# Patient Record
Sex: Male | Born: 1965 | Race: White | Hispanic: No | Marital: Married | Smoking: Never smoker
Health system: Southern US, Community
[De-identification: ages and names within clinical notes are randomized; demographics above are authoritative.]

## PROBLEM LIST (undated history)

## (undated) DIAGNOSIS — K219 Gastro-esophageal reflux disease without esophagitis: Secondary | ICD-10-CM

## (undated) HISTORY — DX: Gastro-esophageal reflux disease without esophagitis: K21.9

## (undated) HISTORY — PX: KNEE ARTHROSCOPY: SUR90

## (undated) HISTORY — PX: HERNIA REPAIR: SHX51

---

## 2014-08-03 ENCOUNTER — Ambulatory Visit: Payer: Self-pay | Admitting: Family Medicine

## 2014-08-17 ENCOUNTER — Ambulatory Visit: Payer: Self-pay | Admitting: Physician Assistant

## 2017-08-04 ENCOUNTER — Encounter: Payer: Self-pay | Admitting: *Deleted

## 2017-08-04 ENCOUNTER — Telehealth: Payer: Self-pay | Admitting: Registered Nurse

## 2017-08-04 ENCOUNTER — Encounter: Payer: Self-pay | Admitting: Registered Nurse

## 2017-08-04 ENCOUNTER — Ambulatory Visit
Admission: EM | Admit: 2017-08-04 | Discharge: 2017-08-04 | Disposition: A | Discharge status: Home / Self Care | Payer: BLUE CROSS/BLUE SHIELD | Attending: Registered Nurse | Admitting: Registered Nurse

## 2017-08-04 ENCOUNTER — Ambulatory Visit
Admit: 2017-08-04 | Discharge: 2017-08-04 | Disposition: A | Discharge status: Home / Self Care | Payer: BLUE CROSS/BLUE SHIELD | Attending: Registered Nurse | Admitting: Registered Nurse

## 2017-08-04 DIAGNOSIS — R2242 Localized swelling, mass and lump, left lower limb: Secondary | ICD-10-CM

## 2017-08-04 DIAGNOSIS — M79662 Pain in left lower leg: Secondary | ICD-10-CM | POA: Diagnosis not present

## 2017-08-04 DIAGNOSIS — M7989 Other specified soft tissue disorders: Secondary | ICD-10-CM

## 2017-08-04 DIAGNOSIS — M79605 Pain in left leg: Secondary | ICD-10-CM | POA: Insufficient documentation

## 2017-08-04 MED ORDER — SULFAMETHOXAZOLE-TRIMETHOPRIM 800-160 MG PO TABS: 1.0000 | ORAL_TABLET | Freq: Two times a day (BID) | ORAL | 0 refills | Status: DC

## 2017-08-04 MED ORDER — PREDNISONE 10 MG (21) PO TBPK: ORAL_TABLET | ORAL | 0 refills | Status: DC

## 2017-08-04 MED ORDER — ACETAMINOPHEN 500 MG PO TABS: 1000.0000 mg | ORAL_TABLET | Freq: Four times a day (QID) | ORAL | 0 refills | Status: AC | PRN

## 2017-08-04 NOTE — ED Triage Notes (Signed)
Pt awoke this am with left calf pain, and edema. Denies injury.

## 2017-08-04 NOTE — ED Provider Notes (Signed)
[facility]    CSN: [id number] Arrival date & time: 08/04/17  0850     History   Chief Complaint Chief Complaint  Patient presents with  . Leg Pain    HPI Brian Church is a 52 y.o. male.   51y/o married caucasian male established patient here for evaluation left lower leg pain woke him up in the middle of the night. Has not taken any otc medications and not on any prescription medications.  Swollen, warm spouse a nurse worried he might have blood clot so here for evaluation.  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  Walked a lot this weekend at [facility] otherwise no change in exercise habits, denied trauma.  Denied joint aches, headache, nausea, vomiting, vision changes, tingling/numbness, weakness.  Right hand dominant.  Spouse measured calf this morning and left 1 inch larger than right      History reviewed. No pertinent past medical history.  There are no active problems to display for this patient.   Past Surgical History:  Procedure Laterality Date  . HERNIA REPAIR    . KNEE ARTHROSCOPY Right        Home Medications    Prior to Admission medications   Medication Sig Start Date End Date Taking? Authorizing Provider  acetaminophen (TYLENOL) 500 MG tablet Take 2 tablets (1,000 mg total) by mouth every 6 (six) hours as needed. 08/04/17   Xayla Puzio, Jarold Song, NP    Family History Family History  Problem Relation Age of Onset  . Healthy Mother   . Cancer Father     Social History Social History   Tobacco Use  . Smoking status: Never Smoker  . Smokeless tobacco: Never Used  Substance Use Topics  . Alcohol use: Yes  . Drug use: No     Allergies   Patient has no known allergies.   Review of Systems Review of Systems  Constitutional: Negative for activity change, appetite change, chills, diaphoresis, fatigue, fever and unexpected weight change.  HENT: Negative for  congestion, ear pain, facial swelling, mouth sores, sore throat, trouble swallowing and voice change.   Eyes: Negative for photophobia, pain, discharge, redness, itching and visual disturbance.  Respiratory: Negative for cough, chest tightness, shortness of breath and wheezing.   Cardiovascular: Negative for chest pain, palpitations and leg swelling.  Gastrointestinal: Negative for abdominal pain, blood in stool, diarrhea, nausea and vomiting.  Endocrine: Negative for cold intolerance and heat intolerance.  Genitourinary: Negative for difficulty urinating, dysuria and hematuria.  Musculoskeletal: Positive for myalgias. Negative for arthralgias, back pain, gait problem, joint swelling, neck pain and neck stiffness.  Skin: Positive for color change and rash. Negative for pallor and wound.  Allergic/Immunologic: Negative for environmental allergies and food allergies.  Neurological: Negative for dizziness, tremors, syncope, speech difficulty, weakness, numbness and headaches.  Hematological: Negative for adenopathy. Does not bruise/bleed easily.  Psychiatric/Behavioral: Positive for sleep disturbance. Negative for agitation and confusion. The patient is not nervous/anxious.      Physical Exam Triage Vital Signs ED Triage Vitals  Enc Vitals Group     BP 08/04/17 0905 138/82     Pulse Rate 08/04/17 0905 77     Resp 08/04/17 0905 16     Temp 08/04/17 0905 98.3 F (36.8 C)     Temp Source 08/04/17 0905 Oral     SpO2 08/04/17 0905 98 %     Weight 08/04/17 0910  190 lb (86.2 kg)     Height 08/04/17 0910 5\' 11"  (1.803 m)     Head Circumference --      Peak Flow --      Pain Score 08/04/17 0908 6     Pain Loc --      Pain Edu? --      Excl. in GC? --    No data found.  Updated Vital Signs BP 138/82 (BP Location: Left Arm)   Pulse 77   Temp 98.3 F (36.8 C) (Oral)   Resp 16   Ht 5\' 11"  (1.803 m)   Wt 190 lb (86.2 kg)   SpO2 98%   BMI 26.50 kg/m   Visual Acuity Right Eye  Distance:   Left Eye Distance:   Bilateral Distance:    Right Eye Near:   Left Eye Near:    Bilateral Near:     Physical Exam  Constitutional: He is oriented to person, place, and time. Vital signs are normal. He appears well-developed and well-nourished. He is active and cooperative.  Non-toxic appearance. He does not have a sickly appearance. He does not appear ill. No distress.  HENT:  Head: Normocephalic and atraumatic.  Right Ear: Hearing and external ear normal.  Left Ear: Hearing and external ear normal.  Nose: Nose normal.  Mouth/Throat: Uvula is midline, oropharynx is clear and moist and mucous membranes are normal. No oropharyngeal exudate.  Eyes: Conjunctivae, EOM and lids are normal. Pupils are equal, round, and reactive to light. Right eye exhibits no discharge. Left eye exhibits no discharge. No scleral icterus.  Neck: Trachea normal and normal range of motion. Neck supple. No muscular tenderness present. No neck rigidity. No tracheal deviation, no edema, no erythema and normal range of motion present. No thyromegaly present.  Cardiovascular: Normal rate, regular rhythm, normal heart sounds and intact distal pulses.  Pulses:      Popliteal pulses are 2+ on the right side, and 2+ on the left side.       Posterior tibial pulses are 2+ on the right side, and 2+ on the left side.  Pulmonary/Chest: Effort normal and breath sounds normal. No stridor. No respiratory distress. He has no decreased breath sounds. He has no wheezes. He has no rhonchi. He has no rales. He exhibits no tenderness.  Spoke full sentences without difficulty; no cough observed in exam room  Abdominal: Soft. Normal appearance. He exhibits no distension, no fluid wave and no mass. There is no rigidity and no guarding.  Musculoskeletal: Normal range of motion. He exhibits edema and tenderness. He exhibits no deformity.       Right shoulder: Normal.       Left shoulder: Normal.       Right elbow: Normal.       Left elbow: Normal.       Right hip: Normal.       Left hip: Normal.       Right knee: Normal.       Left knee: Normal.       Right ankle: Normal.       Left ankle: Normal.       Cervical back: Normal.       Thoracic back: Normal.       Lumbar back: Normal.       Right hand: Normal.       Left hand: Normal.       Right lower leg: Normal.       Left lower leg:  He exhibits tenderness, swelling and edema. He exhibits no bony tenderness, no deformity and no laceration.       Legs:      Right foot: Normal.       Left foot: Normal.  Nummular macular erythema gastrocnemius slightly increased temperature and tenderness with palpation along with diffuse nonpitting edema enveloping entire gastrocnemius 1-2+/4 no defect or nodule palpated; no fluctuance; lower extremity blood vessels tender same as surrounding soft tissue; gait sure and steady in hallway/exam room no limp; on/off exam table without difficulty; varicose veins noted bilateral lower extremities patient wearing athletic gear ankle socks  Lymphadenopathy:    He has no cervical adenopathy.  Neurological: He is alert and oriented to person, place, and time. He has normal strength. He is not disoriented. He displays no atrophy and no tremor. No cranial nerve deficit or sensory deficit. He exhibits normal muscle tone. He displays no seizure activity. Coordination and gait normal. GCS eye subscore is 4. GCS verbal subscore is 5. GCS motor subscore is 6.  Extremity strength upper and lower equal 5/5 bilaterally  Skin: Skin is warm, dry and intact. Capillary refill takes less than 2 seconds. Rash noted. No abrasion, no bruising, no burn, no ecchymosis, no laceration, no lesion, no petechiae and no purpura noted. Rash is macular. Rash is not papular, not maculopapular, not nodular, not pustular, not vesicular and not urticarial. He is not diaphoretic. There is erythema. No cyanosis. No pallor. Nails show no clubbing.     Bilateral lower extremity  skin dry some flaking and areas where hair has been rubbed off legs or fallen out; hair pattern distribution lower extremities not even; well circumscribed nummular macular erythema left calf 3cm diameter  Psychiatric: He has a normal mood and affect. His speech is normal and behavior is normal. Judgment and thought content normal. Cognition and memory are normal.  Nursing note and vitals reviewed.    UC Treatments / Results  Labs (all labs ordered are listed, but only abnormal results are displayed) Labs Reviewed - No data to display  EKG  EKG Interpretation None       Radiology US Venous Img Lower Unilateral Left  Result Date: 08/04/2017 CLINICAL DATA:  Left leg pain and swelling for 1 day EXAM: LEFT LOWER EXTREMITY VENOUS DOPPLER ULTRASOUND TECHNIQUE: Gray-scale sonography with graded compression, as well as color Doppler and duplex ultrasound were performed to evaluate the lower extremity deep venous systems from the level of the common femoral vein and including the common femoral, femoral, profunda femoral, popliteal and calf veins including the posterior tibial, peroneal and gastrocnemius veins when visible. The superficial great saphenous vein was also interrogated. Spectral Doppler was utilized to evaluate flow at rest and with distal augmentation maneuvers in the common femoral, femoral and popliteal veins. COMPARISON:  None. FINDINGS: Contralateral Common Femoral Vein: Respiratory phasicity is normal and symmetric with the symptomatic side. No evidence of thrombus. Normal compressibility. Common Femoral Vein: No evidence of thrombus. Normal compressibility, respiratory phasicity and response to augmentation. Saphenofemoral Junction: No evidence of thrombus. Normal compressibility and flow on color Doppler imaging. Profunda Femoral Vein: No evidence of thrombus. Normal compressibility and flow on color Doppler imaging. Femoral Vein: No evidence of thrombus. Normal compressibility,  respiratory phasicity and response to augmentation. Popliteal Vein: No evidence of thrombus. Normal compressibility, respiratory phasicity and response to augmentation. Calf Veins: No evidence of thrombus. Normal compressibility and flow on color Doppler imaging. Superficial Great Saphenous Vein: No evidence of thrombus. Normal compressibility.  Venous Reflux:  None. Other Findings:  None. IMPRESSION: No evidence of deep venous thrombosis. Electronically Signed   By: Alcide CleverMark  Lukens M.D.   On: 08/04/2017 16:17    Procedures Procedures (including critical care time)  Medications Ordered in UC Medications - No data to display   Initial Impression / Assessment and Plan / UC Course  I have reviewed the triage vital signs and the nursing notes.  Pertinent labs & imaging results that were available during my care of the patient were reviewed by me and considered in my medical decision making (see chart for details).     1000 Patient [facility] for doppler US , [place] appt 1600 per patient preference.  Discussed differential diagnoses: DVT, phlebitis/vasculitis, muscle strain, cellulitis/erysipelas.  Compression, elevation, ice 15 minutes TID, avoid high impact exercises/long distance walking until symptoms resolve, calf pumps.  Patient has two hours in car each day driving with varicose veins.  He will work from home today.  Will call patient with results once available after his appt [phone].  Notify me if any new symptoms.  ER/911 if chest pain, worst headache of life, visual changes, shortness of breath, nausea/vomiting  Patient verbalized understanding information/instructions, agreed with plan of care and had no further questions at this time.  1644 Patient contacted and notified US results negative for DVT.  Continue with plan of care as previously discussed.  Consider starting bactrim DS po BID x 7 days if enlarging rash, red streaks, worsening  swelling/pain despite ice/elevation, compression.  Vasculitis would be treated with prednisone taper 10mg  daily with breakfast (60/50/40/30/20/10mg ) #21 [id number] electronic Rx to patient pharmacy of choice.  Continue to avoid high impact exercise until symptoms resolve/compression/elevation, cryotherapy 15 minutes TID.  Pain tolerable and patient has not required any tylenol/NSAIDS.  Ice increases discomfort a little.  He has been wearing compression stockings, icing and elevating left leg.   Discussed with patient spouse can contact me if further questions or concerns as she was not available for US results discussion.  Patient verbalized understanding information/instructions, agreed with plan of care and had no further questions at this time.  2045 spouse contacted me stated swelling has decreased 1/4 inch left calf now only 3/4 inch diameter larger than right.  Patient still wearing compression stockings and has kept leg elevated  She will mark out borders of rash to be able to monitor if erythema macular expanding or contracting with plan of care.  Spouse is RN at Urgent Care facility.  Differential diagnoses discussed with spouse.  Spouse verbalized understanding of information and had no further questions at this time.   Final Clinical Impressions(s) / UC Diagnoses   Final diagnoses:  Pain and swelling of left lower leg    ED Discharge Orders        Ordered    acetaminophen (TYLENOL) 500 MG tablet  Every 6 hours PRN     08/04/17 0927    US Venous Img Lower Unilateral Left  Status:  Canceled     08/04/17 0933    US Venous Img Lower Unilateral Left     08/04/17 0947       Controlled Substance Prescriptions [place] Controlled Substance Registry consulted?No   Barbaraann BarthelBetancourt, Bhavik Cabiness A, NP 08/05/17 [phone]

## 2017-08-04 NOTE — Telephone Encounter (Signed)
Patient notified of negative/normal US results LLE today via telephone.  Continue plan of care as previously discussed.  Patient reported he has had intermittent abdominal papular rash that occurred in the previous week but has resolved prior to left lower leg pain awakened him in the middle of the night last night.  Redness and swelling started this morning.  Denied trauma.  [facility].  [place] [address] [place], [place].  Discussed with patient vasculitis versus muscle strain versus erysipelas is my current differential diagnoses.  Patient denied joint aches, nausea/vomiting, headache, abdomen pain, dyspnea/SOB, chest pain, rashes other parts of body or joint aches.  PMHx varicose veins  BLE  Patient will continue with cryotherapy, OTC NSAIDS, compression stockings, rest, elevation.  If worsening temperature affected area (hot)/edema/macular rash will start bactrim DS po BID x 7 days.  If affected area seems to run along vein will start prednisone taper with breakfast. Discussed in detail symptoms of vasculitis/phlebitis/autoimmune disease symptoms, erysipelas, muscle strain.  12 minutes on phone.  Discussed wife may contact me if further questions or concerns. Patient had no further questions at this time, verbalized understanding of information/instructions, agreed with plan of care and had no further questions at this time.

## 2018-02-07 DIAGNOSIS — R0789 Other chest pain: Secondary | ICD-10-CM | POA: Insufficient documentation

## 2018-02-07 DIAGNOSIS — R002 Palpitations: Secondary | ICD-10-CM | POA: Insufficient documentation

## 2019-11-03 DIAGNOSIS — S86009A Unspecified injury of unspecified Achilles tendon, initial encounter: Secondary | ICD-10-CM | POA: Insufficient documentation

## 2019-11-03 DIAGNOSIS — F419 Anxiety disorder, unspecified: Secondary | ICD-10-CM | POA: Insufficient documentation

## 2019-11-03 DIAGNOSIS — K429 Umbilical hernia without obstruction or gangrene: Secondary | ICD-10-CM | POA: Insufficient documentation

## 2020-02-02 ENCOUNTER — Ambulatory Visit
Admission: EM | Admit: 2020-02-02 | Discharge: 2020-02-02 | Disposition: A | Discharge status: Home / Self Care | Payer: BC Managed Care – PPO

## 2020-02-02 ENCOUNTER — Encounter: Payer: Self-pay | Admitting: Gynecology

## 2020-02-02 ENCOUNTER — Ambulatory Visit (INDEPENDENT_AMBULATORY_CARE_PROVIDER_SITE_OTHER): Payer: BC Managed Care – PPO

## 2020-02-02 ENCOUNTER — Other Ambulatory Visit: Payer: Self-pay

## 2020-02-02 DIAGNOSIS — M5136 Other intervertebral disc degeneration, lumbar region: Secondary | ICD-10-CM | POA: Diagnosis not present

## 2020-02-02 MED ORDER — METAXALONE 800 MG PO TABS: 800.0000 mg | ORAL_TABLET | Freq: Three times a day (TID) | ORAL | 0 refills | Status: DC

## 2020-02-02 MED ORDER — MELOXICAM 15 MG PO TABS
15.0000 mg | ORAL_TABLET | Freq: Every day | ORAL | 0 refills | Status: DC
Start: 2020-02-02 — End: 2023-06-03

## 2020-02-02 NOTE — ED Triage Notes (Signed)
Pt. C/o lower back  Pain x weeks.

## 2020-02-02 NOTE — ED Provider Notes (Signed)
[facility]    CSN: [id number] Arrival date & time: 02/02/20  1041      History   Chief Complaint Chief Complaint  Patient presents with  . Back Pain    HPI Brian Church is a 54 y.o. male.   HPI  54 year old male presents with several month history of recurring low back pain with left lower extremity radicular symptoms.  He  does not remember a specific incident injuring his back but has had back problems in the past.  This time the pain is mostly in the center lower portion of his back just above the sacrum with radiation occasionally into the left lower extremity.  He denies any bowel or bladder dysfunction.  He has been taking Flexeril and ibuprofen but without much success.  Denies any recent weight loss.       History reviewed. No pertinent past medical history.  There are no problems to display for this patient.   Past Surgical History:  Procedure Laterality Date  . HERNIA REPAIR    . KNEE ARTHROSCOPY Right        Home Medications    Prior to Admission medications   Medication Sig Start Date End Date Taking? Authorizing Provider  acetaminophen (TYLENOL) 500 MG tablet Take 2 tablets (1,000 mg total) by mouth every 6 (six) hours as needed. 08/04/17  Yes Betancourt, Jarold Song, NP  ibuprofen (ADVIL) 600 MG tablet Take by mouth.   Yes [provider]  Multiple Vitamin (MULTI-VITAMIN) tablet Take by mouth.   Yes [provider]  meloxicam (MOBIC) 15 MG tablet Take 1 tablet (15 mg total) by mouth daily. Take with food. 02/02/20   Lutricia Feil, PA-C  metaxalone (SKELAXIN) 800 MG tablet Take 1 tablet (800 mg total) by mouth 3 (three) times daily. 02/02/20   Lutricia Feil, PA-C  sulfamethoxazole-trimethoprim (BACTRIM DS,SEPTRA DS) 800-160 MG tablet Take 1 tablet by mouth 2 (two) times daily. 08/04/17   Betancourt, Jarold Song, NP    Family History Family History  Problem Relation Age of Onset  . Healthy Mother   . Cancer  Father     Social History Social History   Tobacco Use  . Smoking status: Never Smoker  . Smokeless tobacco: Never Used  Vaping Use  . Vaping Use: Never used  Substance Use Topics  . Alcohol use: Yes  . Drug use: No     Allergies   Patient has no known allergies.   Review of Systems Review of Systems  Constitutional: Positive for activity change. Negative for appetite change, chills, diaphoresis, fatigue and fever.  Musculoskeletal: Positive for back pain.  All other systems reviewed and are negative.    Physical Exam Triage Vital Signs ED Triage Vitals  Enc Vitals Group     BP 02/02/20 1059 (!) 131/95     Pulse Rate 02/02/20 1059 87     Resp 02/02/20 1055 16     Temp 02/02/20 1059 98.4 F (36.9 C)     Temp Source 02/02/20 1055 Oral     SpO2 02/02/20 1059 98 %     Weight 02/02/20 1057 195 lb (88.5 kg)     Height 02/02/20 1057 5\' 11"  (1.803 m)     Head Circumference --      Peak Flow --      Pain Score 02/02/20 1056 1     Pain Loc --      Pain Edu? --      Excl. in  GC? --    No data found.  Updated Vital Signs BP (!) 131/95 (BP Location: Left Arm)   Pulse 87   Temp 98.4 F (36.9 C) (Oral)   Resp 16   Ht 5\' 11"  (1.803 m)   Wt 195 lb (88.5 kg)   SpO2 98%   BMI 27.20 kg/m   Visual Acuity Right Eye Distance:   Left Eye Distance:   Bilateral Distance:    Right Eye Near:   Left Eye Near:    Bilateral Near:     Physical Exam Vitals and nursing note reviewed.  Constitutional:      General: He is not in acute distress.    Appearance: Normal appearance. He is normal weight. He is not ill-appearing or toxic-appearing.  HENT:     Head: Normocephalic and atraumatic.  Eyes:     Conjunctiva/sclera: Conjunctivae normal.  Musculoskeletal:        General: Tenderness present.     Cervical back: Normal range of motion and neck supple.     Comments: Examination of the lumbar spine shows a level pelvis in stance.  Patient is able to forward flex with his  hands level of his ankles.  Returning to upright posture is slightly more difficult.  Lateral flexion bilaterally shows blunting of the lower lumbar segments.  There is tenderness over the lower lumbar segments midline just above the sacrum.  There is no significant sacroiliac joint tenderness.  He is able to toe and heel walk normally.  EHL peroneal and anterior tibialis muscles are strong to clinical testing.  He has normal sensation in the lower extremities.  Straight leg raise testing is normal in the seated position.  Skin:    General: Skin is warm and dry.  Neurological:     General: No focal deficit present.     Mental Status: He is alert and oriented to person, place, and time.  Psychiatric:        Mood and Affect: Mood normal.        Behavior: Behavior normal.        Thought Content: Thought content normal.        Judgment: Judgment normal.      UC Treatments / Results  Labs (all labs ordered are listed, but only abnormal results are displayed) Labs Reviewed - No data to display  EKG   Radiology No results found.  Procedures Procedures (including critical care time)  Medications Ordered in UC Medications - No data to display  Initial Impression / Assessment and Plan / UC Course  I have reviewed the triage vital signs and the nursing notes.  Pertinent labs & imaging results that were available during my care of the patient were reviewed by me and considered in my medical decision making (see chart for details).   54 year old male presents with several month history of midline low back pain that is radicular at times to the left lower extremity.  He has no recent injury to his back.  He states that he has had recurring back pain in the past.  Denies any bowel or bladder dysfunction.  Physical exam was reassuring.  Because of the amount of time that he has been experiencing the pain it was deemed prudent to obtain  lumbosacral films.  These were performed and I read the  x-rays initially showing degenerative disc disease at L4-5.  This was later confirmed by radiology.  I reviewed the x-rays with the patient in detail.  Recommended core strengthening  to help alleviate recurrence of the back pain.  When he does have flareups I have prescribed Mobic 15 mg daily with food and also Skelaxin as necessary with appropriate precautions.  Use ice on the area with acute flareups.  He is not improving or is worsening over time he should follow-up with his primary care physician.   Final Clinical Impressions(s) / UC Diagnoses   Final diagnoses:  DDD (degenerative disc disease), lumbar   Discharge Instructions   None    ED Prescriptions    Medication Sig Dispense Auth. Provider   meloxicam (MOBIC) 15 MG tablet Take 1 tablet (15 mg total) by mouth daily. Take with food. 30 tablet Lutricia Feil, PA-C   metaxalone (SKELAXIN) 800 MG tablet Take 1 tablet (800 mg total) by mouth 3 (three) times daily. 21 tablet Lutricia Feil, PA-C     PDMP not reviewed this encounter.   Lutricia Feil, PA-C 02/02/20 1758

## 2020-11-06 DIAGNOSIS — K4090 Unilateral inguinal hernia, without obstruction or gangrene, not specified as recurrent: Secondary | ICD-10-CM | POA: Insufficient documentation

## 2020-12-20 IMAGING — CR DG LUMBAR SPINE COMPLETE 4+V
5 series · 5 of 5 positions shown · non-contrast
Comparison: None.

CLINICAL DATA: Low back pain 4 months. Pain radiates down left leg.
No injury.

EXAM:
LUMBAR SPINE - COMPLETE 4+ VIEW

[l-spine ap]
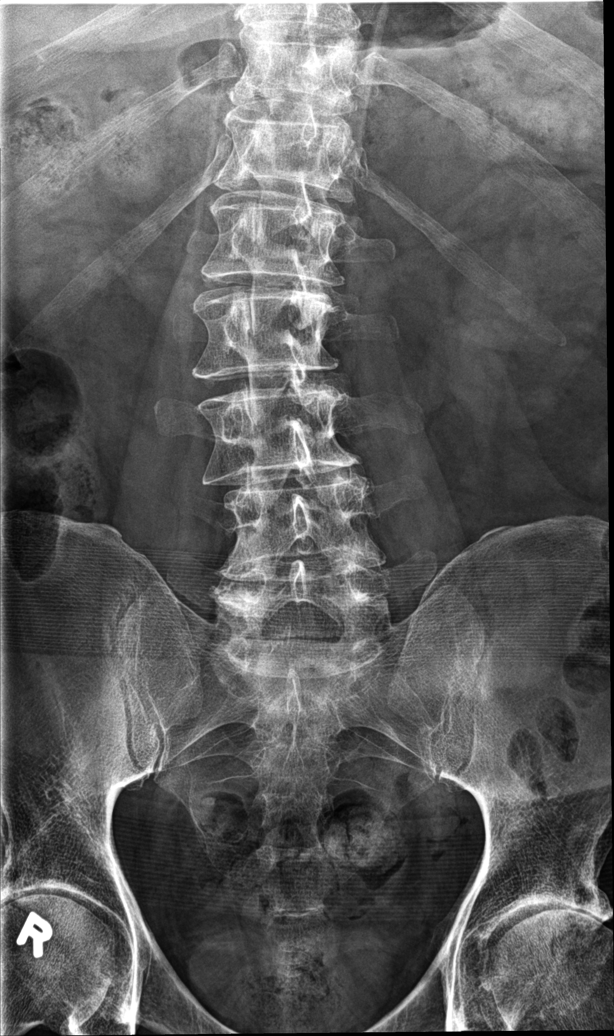

[l-spine obl (1 of 2)]
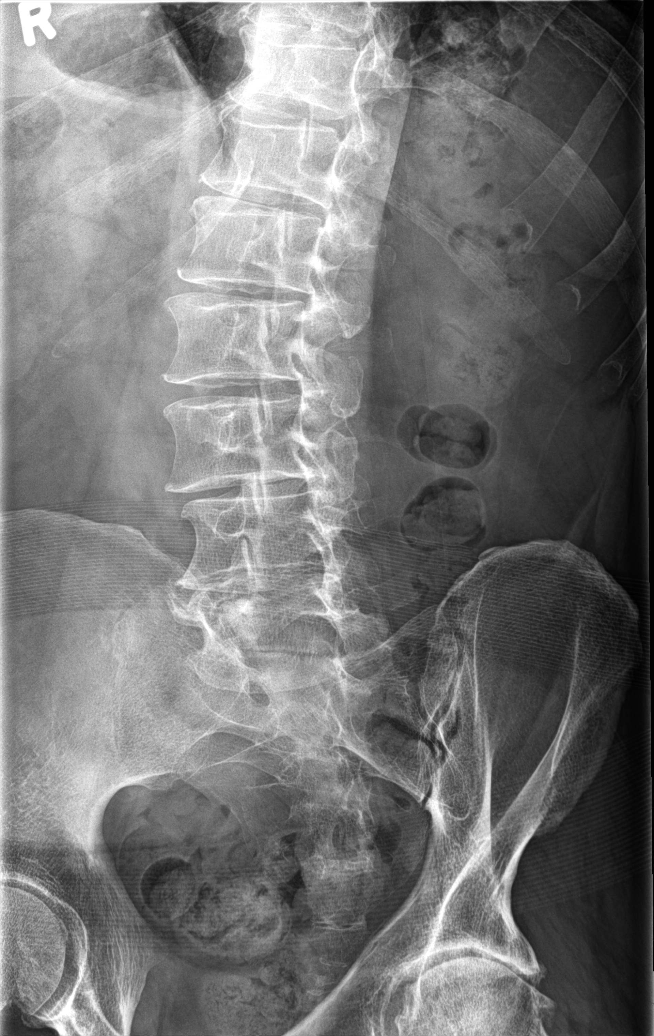

[l-spine obl (2 of 2)]
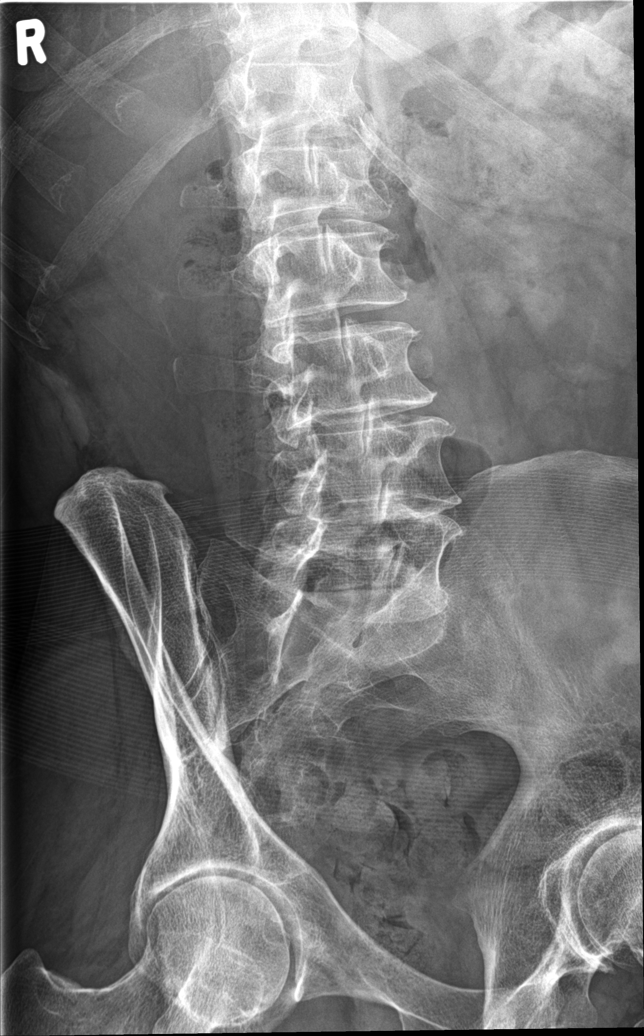

[l-spine lat]
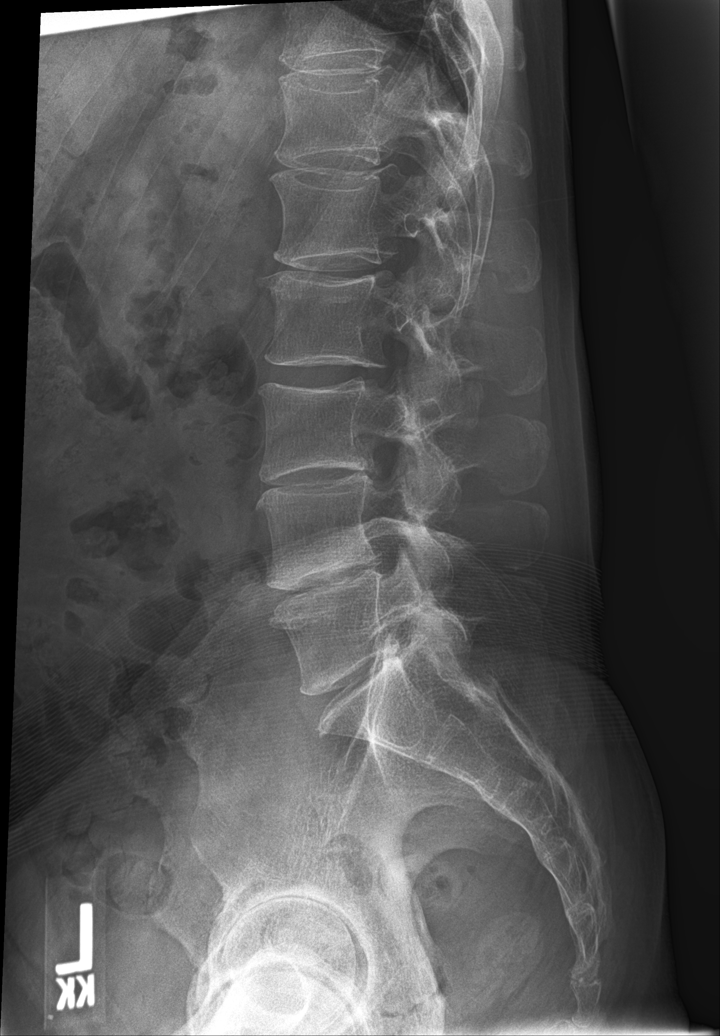

[l-spine spot]
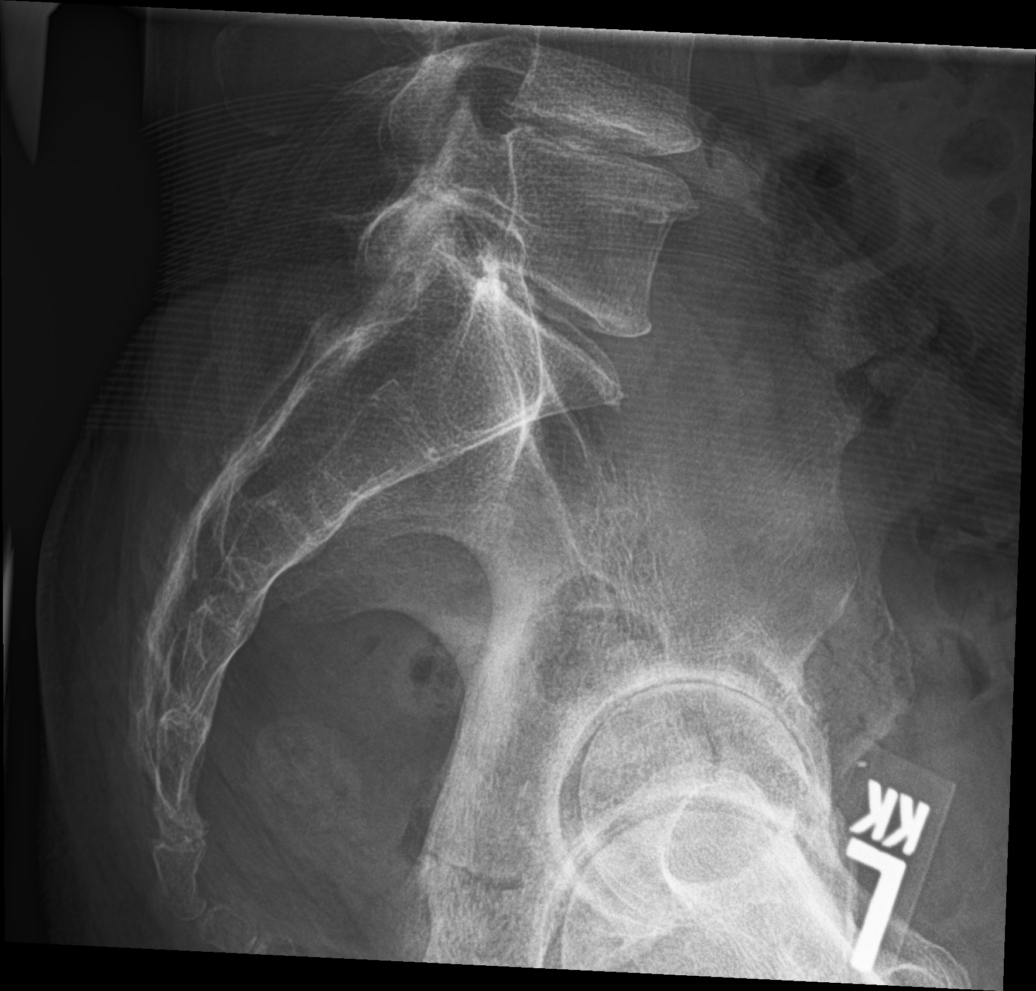

[5 of 5 positions shown; findings below may reference images not displayed]

FINDINGS: Mild curvature of the lumbar spine convex right. Vertebral body
heights are normal. There is mild spondylosis throughout the lumbar
spine to include facet arthropathy over the lower lumbar spine.
Multilevel disc space narrowing throughout the lumbar spine worse at
the L4-5 level with mild sparing of the L2-3 level. No compression
fracture or spondylolisthesis/spondylolysis. Mild symmetric
degenerative change of the hips.
IMPRESSION: 1.  No acute findings.

2. Mild spondylosis throughout the lumbar spine with multilevel disc
disease most prominent at the L4-5 level.

## 2022-11-01 DIAGNOSIS — Z0000 Encounter for general adult medical examination without abnormal findings: Secondary | ICD-10-CM | POA: Diagnosis not present

## 2022-11-01 DIAGNOSIS — E663 Overweight: Secondary | ICD-10-CM | POA: Diagnosis not present

## 2022-11-01 DIAGNOSIS — Z1331 Encounter for screening for depression: Secondary | ICD-10-CM | POA: Diagnosis not present

## 2022-11-01 DIAGNOSIS — Z125 Encounter for screening for malignant neoplasm of prostate: Secondary | ICD-10-CM | POA: Diagnosis not present

## 2022-11-01 DIAGNOSIS — Z1330 Encounter for screening examination for mental health and behavioral disorders, unspecified: Secondary | ICD-10-CM | POA: Diagnosis not present

## 2023-06-03 ENCOUNTER — Ambulatory Visit (INDEPENDENT_AMBULATORY_CARE_PROVIDER_SITE_OTHER): Payer: BC Managed Care – PPO | Admitting: Nurse Practitioner

## 2023-06-03 ENCOUNTER — Encounter: Payer: Self-pay | Admitting: Nurse Practitioner

## 2023-06-03 VITALS — BP 115/70 | HR 61 | Temp 97.6°F | Ht 71.0 in | Wt 198.8 lb

## 2023-06-03 DIAGNOSIS — K0889 Other specified disorders of teeth and supporting structures: Secondary | ICD-10-CM

## 2023-06-03 DIAGNOSIS — R42 Dizziness and giddiness: Secondary | ICD-10-CM

## 2023-06-03 DIAGNOSIS — Z7689 Persons encountering health services in other specified circumstances: Secondary | ICD-10-CM

## 2023-06-03 DIAGNOSIS — Z6827 Body mass index (BMI) 27.0-27.9, adult: Secondary | ICD-10-CM | POA: Insufficient documentation

## 2023-06-03 MED ORDER — TRAMADOL HCL 50 MG PO TABS: 50.0000 mg | ORAL_TABLET | Freq: Three times a day (TID) | ORAL | 0 refills | Status: AC | PRN

## 2023-06-03 MED ORDER — MECLIZINE HCL 25 MG PO TABS: 25.0000 mg | ORAL_TABLET | Freq: Three times a day (TID) | ORAL | 2 refills | Status: DC | PRN

## 2023-06-03 NOTE — Assessment & Plan Note (Signed)
Suspect some vertigo.  Improved with Epley's maneuver. Will send Meclizine to help with symptoms PRN.  Follow up if not improved.

## 2023-06-03 NOTE — Progress Notes (Signed)
BP 115/70   Pulse 61   Temp 97.6 F (36.4 C) (Oral)   Ht 5\' 11"  (1.803 m)   Wt 198 lb 12.8 oz (90.2 kg)   SpO2 97%   BMI 27.73 kg/m    Subjective:    Patient ID: Brian Church, male    DOB: Oct 07, 1965, 57 y.o.   MRN: [id number]  HPI: Brian Church is a 57 y.o. male  Chief Complaint  Patient presents with   Dizziness    Patient states he has been experiencing vertigo spells for the last few months. States this comes and goes but wants to discuss possibly having a medication on hand for when he has a spell.    Dental Pain   Patient presents to clinic to establish care with new PCP.  Introduced to Publishing rights manager role and practice setting.  All questions answered.  Discussed provider/patient relationship and expectations.  Patient reports a history of dizziness and palpitations. Had hernia surgery a few years ago.   Patient denies a history of: Hypertension, Elevated Cholesterol, Diabetes, Thyroid problems, Depression, Anxiety, Neurological problems, and Abdominal problems.   DIZZINESS Has had dizziness and improved with Epleys.   Duration:  on and off for a few years Description of symptoms: room spinning Duration of episode: minutes Dizziness frequency: recurrent Provoking factors: none Aggravating factors:   elevators  Triggered by rolling over in bed: no Triggered by bending over: no Aggravated by head movement: yes Aggravated by exertion, coughing, loud noises: no Recent head injury: no Recent or current viral symptoms: no History of vasovagal episodes: no Nausea: yes Vomiting: yes Tinnitus: no Hearing loss: no Aural fullness: no Headache: no Photophobia/phonophobia: no Unsteady gait: no Postural instability: no Diplopia, dysarthria, dysphagia or weakness: no Related to exertion: no Pallor: no Diaphoresis: no Dyspnea: no Chest pain: no  Patient states he had a crown a couple of weeks ago on his tooth.  A couple of nights ago he had  a tremendous amount of pain.  He is using ice and ibuprofen/tylenol and did not have any improvement.  This has been the worst pain he has experienced.  Has an appointment on Monday with his dentist but hasn't been able to sleep due to the pain.   Active Ambulatory Problems    Diagnosis Date Noted   BMI 27.0-27.9,adult 06/03/2023   Dizziness 06/03/2023   Resolved Ambulatory Problems    Diagnosis Date Noted   No Resolved Ambulatory Problems   Past Medical History:  Diagnosis Date   GERD (gastroesophageal reflux disease)    Past Surgical History:  Procedure Laterality Date   HERNIA REPAIR     KNEE ARTHROSCOPY Right    Family History  Problem Relation Age of Onset   Healthy Mother    Cancer Father      Review of Systems  HENT:  Positive for dental problem.   Gastrointestinal:  Positive for nausea.  Neurological:  Positive for dizziness.    Per HPI unless specifically indicated above     Objective:    BP 115/70   Pulse 61   Temp 97.6 F (36.4 C) (Oral)   Ht 5\' 11"  (1.803 m)   Wt 198 lb 12.8 oz (90.2 kg)   SpO2 97%   BMI 27.73 kg/m   Wt Readings from Last 3 Encounters:  06/03/23 198 lb 12.8 oz (90.2 kg)  02/02/20 195 lb (88.5 kg)  08/04/17 190 lb (86.2 kg)    Physical Exam Vitals and nursing note reviewed.  Constitutional:      General: He is not in acute distress.    Appearance: Normal appearance. He is not ill-appearing, toxic-appearing or diaphoretic.  HENT:     Head: Normocephalic.     Right Ear: External ear normal.     Left Ear: External ear normal.     Nose: Nose normal. No congestion or rhinorrhea.     Mouth/Throat:     Mouth: Mucous membranes are moist.  Eyes:     General:        Right eye: No discharge.        Left eye: No discharge.     Extraocular Movements: Extraocular movements intact.     Conjunctiva/sclera: Conjunctivae normal.     Pupils: Pupils are equal, round, and reactive to light.  Cardiovascular:     Rate and Rhythm: Normal  rate and regular rhythm.     Heart sounds: No murmur heard. Pulmonary:     Effort: Pulmonary effort is normal. No respiratory distress.     Breath sounds: Normal breath sounds. No wheezing, rhonchi or rales.  Abdominal:     General: Abdomen is flat. Bowel sounds are normal.  Musculoskeletal:     Cervical back: Normal range of motion and neck supple.  Skin:    General: Skin is warm and dry.     Capillary Refill: Capillary refill takes less than 2 seconds.  Neurological:     General: No focal deficit present.     Mental Status: He is alert and oriented to person, place, and time.  Psychiatric:        Mood and Affect: Mood normal.        Behavior: Behavior normal.        Thought Content: Thought content normal.        Judgment: Judgment normal.     No results found for this or any previous visit.    Assessment & Plan:   Problem List Items Addressed This Visit       Other   BMI 27.0-27.9,adult - Primary   Recommended eating smaller high protein, low fat meals more frequently and exercising 30 mins a day 5 times a week with a goal of 10-15lb weight loss in the next 3 months.       Dizziness   Suspect some vertigo.  Improved with Epley's maneuver. Will send Meclizine to help with symptoms PRN.  Follow up if not improved.       Other Visit Diagnoses       Pain, dental       Had recent crown placed which started causing pain two days ago. No improvement with ibuprofen/tylenol. Will send tramadol. Keep appt with dentist for monday.     Encounter to establish care            Follow up plan: Return in about 6 months (around 12/02/2023) for Physical and Fasting labs.

## 2023-06-03 NOTE — Assessment & Plan Note (Signed)
Recommended eating smaller high protein, low fat meals more frequently and exercising 30 mins a day 5 times a week with a goal of 10-15lb weight loss in the next 3 months.  

## 2023-12-02 ENCOUNTER — Encounter: Payer: Self-pay | Admitting: Nurse Practitioner

## 2023-12-02 ENCOUNTER — Ambulatory Visit (INDEPENDENT_AMBULATORY_CARE_PROVIDER_SITE_OTHER): Payer: Self-pay | Admitting: Nurse Practitioner

## 2023-12-02 VITALS — BP 123/80 | HR 67 | Ht 71.0 in | Wt 187.0 lb

## 2023-12-02 DIAGNOSIS — Z23 Encounter for immunization: Secondary | ICD-10-CM | POA: Diagnosis not present

## 2023-12-02 DIAGNOSIS — Z8719 Personal history of other diseases of the digestive system: Secondary | ICD-10-CM | POA: Insufficient documentation

## 2023-12-02 DIAGNOSIS — Z136 Encounter for screening for cardiovascular disorders: Secondary | ICD-10-CM | POA: Diagnosis not present

## 2023-12-02 DIAGNOSIS — Z0000 Encounter for general adult medical examination without abnormal findings: Secondary | ICD-10-CM | POA: Diagnosis not present

## 2023-12-02 NOTE — Progress Notes (Signed)
 BP 123/80   Pulse 67   Ht 5' 11 (1.803 m)   Wt 187 lb (84.8 kg)   BMI 26.08 kg/m    Subjective:    Patient ID: Brian Church, male    DOB: 1965/09/07, 58 y.o.   MRN: [id number]  HPI: Brian Church is a 58 y.o. male presenting on 12/02/2023 for comprehensive medical examination. Current medical complaints include:none  He currently lives with: Interim Problems from his last visit: no  Depression Screen done today and results listed below:     12/02/2023    8:25 AM 06/03/2023    8:40 AM  Depression screen PHQ 2/9  Decreased Interest 0 0  Down, Depressed, Hopeless 0 0  PHQ - 2 Score 0 0  Altered sleeping 0 1  Tired, decreased energy 0 1  Change in appetite 0 0  Feeling bad or failure about yourself  0 0  Trouble concentrating 0 0  Moving slowly or fidgety/restless 0 0  Suicidal thoughts 0 0  PHQ-9 Score 0 2  Difficult doing work/chores  Not difficult at all    The patient does not have a history of falls. I did complete a risk assessment for falls. A plan of care for falls was documented.   Past Medical History:  Past Medical History:  Diagnosis Date   GERD (gastroesophageal reflux disease)     Surgical History:  Past Surgical History:  Procedure Laterality Date   HERNIA REPAIR     KNEE ARTHROSCOPY Right     Medications:  Current Outpatient Medications on File Prior to Visit  Medication Sig   acetaminophen  (TYLENOL ) 500 MG tablet Take 2 tablets (1,000 mg total) by mouth every 6 (six) hours as needed.   ibuprofen (ADVIL) 600 MG tablet Take 600 mg by mouth every 6 (six) hours as needed.   Multiple Vitamin (MULTI-VITAMIN) tablet Take by mouth.   No current facility-administered medications on file prior to visit.    Allergies:  No Known Allergies  Social History:  Social History   Socioeconomic History   Marital status: Married    Spouse name: Not on file   Number of children: Not on file   Years of education: Not on file    Highest education level: Bachelor's degree (e.g., BA, AB, BS)  Occupational History   Not on file  Tobacco Use   Smoking status: Never   Smokeless tobacco: Never  Vaping Use   Vaping status: Never Used  Substance and Sexual Activity   Alcohol use: Yes    Alcohol/week: 1.0 standard drink of alcohol    Types: 1 Standard drinks or equivalent per week   Drug use: No   Sexual activity: Yes  Other Topics Concern   Not on file  Social History Narrative   Not on file   Social Drivers of Health   Financial Resource Strain: Low Risk  (11/28/2023)   Overall Financial Resource Strain (CARDIA)    Difficulty of Paying Living Expenses: Not hard at all  Food Insecurity: No Food Insecurity (11/28/2023)   Hunger Vital Sign    Worried About Running Out of Food in the Last Year: Never true    Ran Out of Food in the Last Year: Never true  Transportation Needs: No Transportation Needs (11/28/2023)   PRAPARE - Administrator, Civil Service (Medical): No    Lack of Transportation (Non-Medical): No  Physical Activity: Sufficiently Active (11/28/2023)   Exercise Vital Sign    Days  of Exercise per Week: 4 days    Minutes of Exercise per Session: 80 min  Stress: No Stress Concern Present (11/28/2023)   [employer] of Occupational Health - Occupational Stress Questionnaire    Feeling of Stress : Not at all  Social Connections: Unknown (11/28/2023)   Social Connection and Isolation Panel    Frequency of Communication with Friends and Family: Once a week    Frequency of Social Gatherings with Friends and Family: Once a week    Attends Religious Services: Patient declined    Database administrator or Organizations: No    Attends Banker Meetings: Never    Marital Status: Married  Catering manager Violence: Not At Risk (06/03/2023)   Humiliation, Afraid, Rape, and Kick questionnaire    Fear of Current or Ex-Partner: No    Emotionally Abused: No    Physically Abused: No     Sexually Abused: No   Social History   Tobacco Use  Smoking Status Never  Smokeless Tobacco Never   Social History   Substance and Sexual Activity  Alcohol Use Yes   Alcohol/week: 1.0 standard drink of alcohol   Types: 1 Standard drinks or equivalent per week    Family History:  Family History  Problem Relation Age of Onset   Healthy Mother    Cancer Father     Past medical history, surgical history, medications, allergies, family history and social history reviewed with patient today and changes made to appropriate areas of the chart.   Review of Systems  All other systems reviewed and are negative.  All other ROS negative except what is listed above and in the HPI.      Objective:    BP 123/80   Pulse 67   Ht 5' 11 (1.803 m)   Wt 187 lb (84.8 kg)   BMI 26.08 kg/m   Wt Readings from Last 3 Encounters:  12/02/23 187 lb (84.8 kg)  06/03/23 198 lb 12.8 oz (90.2 kg)  02/02/20 195 lb (88.5 kg)    Physical Exam Vitals and nursing note reviewed.  Constitutional:      General: He is not in acute distress.    Appearance: Normal appearance. He is not ill-appearing, toxic-appearing or diaphoretic.  HENT:     Head: Normocephalic.     Right Ear: Tympanic membrane, ear canal and external ear normal.     Left Ear: Tympanic membrane, ear canal and external ear normal.     Nose: Nose normal. No congestion or rhinorrhea.     Mouth/Throat:     Mouth: Mucous membranes are moist.   Eyes:     General:        Right eye: No discharge.        Left eye: No discharge.     Extraocular Movements: Extraocular movements intact.     Conjunctiva/sclera: Conjunctivae normal.     Pupils: Pupils are equal, round, and reactive to light.    Cardiovascular:     Rate and Rhythm: Normal rate and regular rhythm.     Heart sounds: No murmur heard. Pulmonary:     Effort: Pulmonary effort is normal. No respiratory distress.     Breath sounds: Normal breath sounds. No wheezing, rhonchi or  rales.  Abdominal:     General: Abdomen is flat. Bowel sounds are normal. There is no distension.     Palpations: Abdomen is soft.     Tenderness: There is no abdominal tenderness. There is no guarding.  Musculoskeletal:     Cervical back: Normal range of motion and neck supple.   Skin:    General: Skin is warm and dry.     Capillary Refill: Capillary refill takes less than 2 seconds.   Neurological:     General: No focal deficit present.     Mental Status: He is alert and oriented to person, place, and time.     Cranial Nerves: No cranial nerve deficit.     Motor: No weakness.     Deep Tendon Reflexes: Reflexes normal.   Psychiatric:        Mood and Affect: Mood normal.        Behavior: Behavior normal.        Thought Content: Thought content normal.        Judgment: Judgment normal.     No results found for this or any previous visit.    Assessment & Plan:   Problem List Items Addressed This Visit   None Visit Diagnoses       Annual physical exam    -  Primary     Screening for ischemic heart disease         Need for shingles vaccine            Discussed aspirin prophylaxis for myocardial infarction prevention and decision was it was not indicated  LABORATORY TESTING:  Health maintenance labs ordered today as discussed above.   The natural history of prostate cancer and ongoing controversy regarding screening and potential treatment outcomes of prostate cancer has been discussed with the patient. The meaning of a false positive PSA and a false negative PSA has been discussed. He indicates understanding of the limitations of this screening test and wishes to proceed with screening PSA testing.   IMMUNIZATIONS:   - Tdap: Tetanus vaccination status reviewed: last tetanus booster within 10 years. - Influenza: Postponed to flu season - Pneumovax: Not applicable - Prevnar: Not applicable - COVID: Not applicable - HPV: Not applicable - Shingrix vaccine:  Refused  SCREENING: - Colonoscopy: Up to date  Discussed with patient purpose of the colonoscopy is to detect colon cancer at curable precancerous or early stages   - AAA Screening: Not applicable  -Hearing Test: Not applicable  -Spirometry: Not applicable   PATIENT COUNSELING:    Sexuality: Discussed sexually transmitted diseases, partner selection, use of condoms, avoidance of unintended pregnancy  and contraceptive alternatives.   Advised to avoid cigarette smoking.  I discussed with the patient that most people either abstain from alcohol or drink within safe limits (<=14/week and <=4 drinks/occasion for males, <=7/weeks and <= 3 drinks/occasion for females) and that the risk for alcohol disorders and other health effects rises proportionally with the number of drinks per week and how often a drinker exceeds daily limits.  Discussed cessation/primary prevention of drug use and availability of treatment for abuse.   Diet: Encouraged to adjust caloric intake to maintain  or achieve ideal body weight, to reduce intake of dietary saturated fat and total fat, to limit sodium intake by avoiding high sodium foods and not adding table salt, and to maintain adequate dietary potassium and calcium preferably from fresh fruits, vegetables, and low-fat dairy products.    stressed the importance of regular exercise  Injury prevention: Discussed safety belts, safety helmets, smoke detector, smoking near bedding or upholstery.   Dental health: Discussed importance of regular tooth brushing, flossing, and dental visits.   Follow up plan: NEXT PREVENTATIVE PHYSICAL DUE IN 1  YEAR. No follow-ups on file.

## 2023-12-03 LAB — COMPREHENSIVE METABOLIC PANEL WITH GFR
ALT: 18 IU/L (ref 0–44)
AST: 21 IU/L (ref 0–40)
Albumin: 4.4 g/dL (ref 3.8–4.9)
Alkaline Phosphatase: 70 IU/L (ref 44–121)
BUN/Creatinine Ratio: 28 — ABNORMAL HIGH (ref 9–20)
BUN: 34 mg/dL — ABNORMAL HIGH (ref 6–24)
Bilirubin Total: 0.7 mg/dL (ref 0.0–1.2)
CO2: 21 mmol/L (ref 20–29)
Calcium: 9.3 mg/dL (ref 8.7–10.2)
Chloride: 104 mmol/L (ref 96–106)
Creatinine, Ser: 1.23 mg/dL (ref 0.76–1.27)
Globulin, Total: 1.8 g/dL (ref 1.5–4.5)
Glucose: 84 mg/dL (ref 70–99)
Potassium: 4.4 mmol/L (ref 3.5–5.2)
Sodium: 140 mmol/L (ref 134–144)
Total Protein: 6.2 g/dL (ref 6.0–8.5)
eGFR: 68 mL/min/{1.73_m2} (ref >59)

## 2023-12-03 LAB — CBC WITH DIFFERENTIAL/PLATELET
Basophils Absolute: 0 10*3/uL (ref 0.0–0.2)
Basos: 1 %
EOS (ABSOLUTE): 0.2 10*3/uL (ref 0.0–0.4)
Eos: 3 %
Hematocrit: 41.5 % (ref 37.5–51.0)
Hemoglobin: 13.6 g/dL (ref 13.0–17.7)
Immature Grans (Abs): 0 10*3/uL (ref 0.0–0.1)
Immature Granulocytes: 0 %
Lymphocytes Absolute: 0.6 10*3/uL — ABNORMAL LOW (ref 0.7–3.1)
Lymphs: 12 %
MCH: 30.4 pg (ref 26.6–33.0)
MCHC: 32.8 g/dL (ref 31.5–35.7)
MCV: 93 fL (ref 79–97)
Monocytes Absolute: 0.6 10*3/uL (ref 0.1–0.9)
Monocytes: 12 %
Neutrophils Absolute: 3.7 10*3/uL (ref 1.4–7.0)
Neutrophils: 72 %
Platelets: 248 10*3/uL (ref 150–450)
RBC: 4.48 x10E6/uL (ref 4.14–5.80)
RDW: 13 % (ref 11.6–15.4)
WBC: 5.2 10*3/uL (ref 3.4–10.8)

## 2023-12-03 LAB — LIPID PANEL
Chol/HDL Ratio: 2.7 ratio (ref 0.0–5.0)
Cholesterol, Total: 188 mg/dL (ref 100–199)
HDL: 70 mg/dL (ref >39)
LDL Chol Calc (NIH): 107 mg/dL — ABNORMAL HIGH (ref 0–99)
Triglycerides: 61 mg/dL (ref 0–149)
VLDL Cholesterol Cal: 11 mg/dL (ref 5–40)

## 2023-12-03 LAB — PSA: Prostate Specific Ag, Serum: 0.5 ng/mL (ref 0.0–4.0)

## 2023-12-03 LAB — TSH: TSH: 2.74 u[IU]/mL (ref 0.450–4.500)

## 2023-12-05 ENCOUNTER — Ambulatory Visit: Payer: Self-pay | Admitting: Nurse Practitioner

## 2024-01-05 DIAGNOSIS — K4091 Unilateral inguinal hernia, without obstruction or gangrene, recurrent: Secondary | ICD-10-CM | POA: Diagnosis not present

## 2024-01-25 DIAGNOSIS — K4090 Unilateral inguinal hernia, without obstruction or gangrene, not specified as recurrent: Secondary | ICD-10-CM | POA: Diagnosis not present

## 2024-01-25 DIAGNOSIS — K4091 Unilateral inguinal hernia, without obstruction or gangrene, recurrent: Secondary | ICD-10-CM | POA: Diagnosis not present

## 2024-01-25 DIAGNOSIS — Z79899 Other long term (current) drug therapy: Secondary | ICD-10-CM | POA: Diagnosis not present
